# Patient Record
Sex: Female | Born: 1965 | Hispanic: No | Marital: Married | Smoking: Never smoker
Health system: Southern US, Community
[De-identification: ages and names within clinical notes are randomized; demographics above are authoritative.]

## PROBLEM LIST (undated history)

## (undated) DIAGNOSIS — E119 Type 2 diabetes mellitus without complications: Secondary | ICD-10-CM

## (undated) DIAGNOSIS — I10 Essential (primary) hypertension: Secondary | ICD-10-CM

## (undated) HISTORY — DX: Essential (primary) hypertension: I10

---

## 2013-12-11 ENCOUNTER — Other Ambulatory Visit: Payer: Self-pay | Admitting: Internal Medicine

## 2013-12-11 ENCOUNTER — Ambulatory Visit: Payer: Medicaid Other | Attending: Internal Medicine | Admitting: Internal Medicine

## 2013-12-11 VITALS — BP 154/83 | HR 126 | Temp 98.9°F | Resp 15 | Ht 62.0 in | Wt 254.4 lb

## 2013-12-11 DIAGNOSIS — N644 Mastodynia: Secondary | ICD-10-CM

## 2013-12-11 DIAGNOSIS — Z1231 Encounter for screening mammogram for malignant neoplasm of breast: Secondary | ICD-10-CM

## 2013-12-11 DIAGNOSIS — M25569 Pain in unspecified knee: Secondary | ICD-10-CM | POA: Insufficient documentation

## 2013-12-11 DIAGNOSIS — I10 Essential (primary) hypertension: Secondary | ICD-10-CM

## 2013-12-11 LAB — COMPLETE METABOLIC PANEL WITH GFR
ALT: 12 U/L (ref 0–35)
AST: 14 U/L (ref 0–37)
Albumin: 4.3 g/dL (ref 3.5–5.2)
Alkaline Phosphatase: 114 U/L (ref 39–117)
GFR, Est African American: >89 mL/min
Potassium: 3.7 mEq/L (ref 3.5–5.3)
Sodium: 139 mEq/L (ref 135–145)
Total Bilirubin: 0.4 mg/dL (ref 0.3–1.2)
Total Protein: 7.7 g/dL (ref 6.0–8.3)

## 2013-12-11 LAB — LIPID PANEL
HDL: 47 mg/dL (ref >39)
Total CHOL/HDL Ratio: 5.5 Ratio
Triglycerides: 244 mg/dL — ABNORMAL HIGH (ref <150)
VLDL: 49 mg/dL — ABNORMAL HIGH (ref 0–40)

## 2013-12-11 LAB — CBC WITH DIFFERENTIAL/PLATELET
Basophils Absolute: 0 10*3/uL (ref 0.0–0.1)
Basophils Relative: 0 % (ref 0–1)
Hemoglobin: 12.8 g/dL (ref 12.0–15.0)
Lymphocytes Relative: 41 % (ref 12–46)
MCHC: 33.1 g/dL (ref 30.0–36.0)
Monocytes Relative: 7 % (ref 3–12)
Neutro Abs: 4.5 10*3/uL (ref 1.7–7.7)
Neutrophils Relative %: 50 % (ref 43–77)
Platelets: 401 10*3/uL — ABNORMAL HIGH (ref 150–400)
RBC: 4.78 MIL/uL (ref 3.87–5.11)
RDW: 14.9 % (ref 11.5–15.5)
WBC: 8.9 10*3/uL (ref 4.0–10.5)

## 2013-12-11 LAB — TSH: TSH: 1.508 u[IU]/mL (ref 0.350–4.500)

## 2013-12-11 LAB — POCT GLYCOSYLATED HEMOGLOBIN (HGB A1C): Hemoglobin A1C: 5.9

## 2013-12-11 MED ORDER — MELOXICAM 7.5 MG PO TABS: 7.5000 mg | ORAL_TABLET | Freq: Two times a day (BID) | ORAL | Status: DC

## 2013-12-11 MED ORDER — METOPROLOL TARTRATE 25 MG PO TABS: 25.0000 mg | ORAL_TABLET | Freq: Two times a day (BID) | ORAL | Status: DC

## 2013-12-11 NOTE — Progress Notes (Signed)
Patient ID: Taylor Aguirre, female   DOB: 1966-09-23, 47 y.o.   MRN: [id number]   CC:  HPI: 47 year old female, here to establish care. Complains of bilateral knee pain. Patient from [place] 1 year ago. She also complains of low back pain. It is worse with ambulation. She has not been taking anything for the pain She has history of hypertension but quit taking her medication before moving to the [place]. She has never had a mammogram. She has never had a Pap smear. She is a nonsmoker nonalcoholic  Family history Father had hypertension   Not on File Past Medical History  Diagnosis Date  . Hypertension    No current outpatient prescriptions on file prior to visit.   No current facility-administered medications on file prior to visit.   History reviewed. No pertinent family history. History   Social History  . Marital Status: Married    Spouse Name: N/A    Number of Children: N/A  . Years of Education: N/A   Occupational History  . Not on file.   Social History Main Topics  . Smoking status: Never Smoker   . Smokeless tobacco: Not on file  . Alcohol Use: No  . Drug Use: Not on file  . Sexual Activity: Not on file   Other Topics Concern  . Not on file   Social History Narrative  . No narrative on file    Review of Systems  Constitutional: Negative for fever, chills, diaphoresis, activity change, appetite change and fatigue.  HENT: Negative for ear pain, nosebleeds, congestion, facial swelling, rhinorrhea, neck pain, neck stiffness and ear discharge.   Eyes: Negative for pain, discharge, redness, itching and visual disturbance.  Respiratory: Negative for cough, choking, chest tightness, shortness of breath, wheezing and stridor.   Cardiovascular: Negative for chest pain, palpitations and leg swelling.  Gastrointestinal: Negative for abdominal distention.  Genitourinary: Negative for dysuria, urgency, frequency, hematuria, flank pain, decreased urine volume,  difficulty urinating and dyspareunia.  Musculoskeletal: Negative for back pain, joint swelling, bilateral knee pain  Neurological: Negative for dizziness, tremors, seizures, syncope, facial asymmetry, speech difficulty, weakness, light-headedness, numbness and headaches.  Hematological: Negative for adenopathy. Does not bruise/bleed easily.  Psychiatric/Behavioral: Negative for hallucinations, behavioral problems, confusion, dysphoric mood, decreased concentration and agitation.    Objective:   Filed Vitals:   12/11/13 1003  BP: 154/83  Pulse: 126  Temp: 98.9 F (37.2 C)  Resp: 15    Physical Exam  Constitutional: Appears well-developed and well-nourished. No distress.  HENT: Normocephalic. External right and left ear normal. Oropharynx is clear and moist.  Eyes: Conjunctivae and EOM are normal. PERRLA, no scleral icterus.  Neck: Normal ROM. Neck supple. No JVD. No tracheal deviation. No thyromegaly.  CVS: RRR, S1/S2 +, no murmurs, no gallops, no carotid bruit.  Pulmonary: Effort and breath sounds normal, no stridor, rhonchi, wheezes, rales.  Abdominal: Soft. BS +,  no distension, tenderness, rebound or guarding.  Musculoskeletal: Normal range of motion. No edema and no tenderness.  Lymphadenopathy: No lymphadenopathy noted, cervical, inguinal. Neuro: Alert. Normal reflexes, muscle tone coordination. No cranial nerve deficit. Skin: Skin is warm and dry. No rash noted. Not diaphoretic. No erythema. No pallor.  Psychiatric: Normal mood and affect. Behavior, judgment, thought content normal.   No results found for this basename: WBC, HGB, HCT, MCV, PLT   No results found for this basename: CREATININE, BUN, NA, K, CL, CO2    No results found for this basename: HGBA1C   Lipid Panel  No results found for this basename: chol, trig, hdl, cholhdl, vldl, ldlcalc       Assessment and plan:   There are no active problems to display for this patient.      Bilateral knee pain Most  likely osteoarthritis We'll obtain plain radiographs We'll prescribe her meloxicam  Hypertension Patient is tachycardic therefore start the patient on metoprolol  Establish care Patient refusing immunization We'll schedule her for mammogram Baseline labs  Patient to return in one month   The patient was given clear instructions to go to ER or return to medical center if symptoms don't improve, worsen or new problems develop. The patient verbalized understanding. The patient was told to call to get any lab results if not heard anything in the next week.

## 2013-12-11 NOTE — Progress Notes (Signed)
Pt is here to establish care and for hypertension. Currently taking medication for hypertension, out of medication and does not know the name of it. Pt is using the interpreter line.

## 2013-12-12 ENCOUNTER — Telehealth: Payer: Self-pay | Admitting: *Deleted

## 2013-12-12 LAB — VITAMIN D 25 HYDROXY (VIT D DEFICIENCY, FRACTURES): Vit D, 25-Hydroxy: 32 ng/mL (ref 30–89)

## 2013-12-12 NOTE — Telephone Encounter (Signed)
Message copied by Ludia Gartland, [place] R on Tue Dec 12, 2013  2:12 PM ------      Message from: Susie Cassette MD, [facility]      Created: Tue Dec 12, 2013 10:39 AM       Please notify patient of the patient's LDL cholesterol and triglycerides are elevated,physical prescription for Lipitor 20 mg per day and asked the patient to start a low-fat diet ------

## 2013-12-21 ENCOUNTER — Ambulatory Visit (HOSPITAL_COMMUNITY): Payer: Medicaid Other | Attending: Internal Medicine

## 2014-01-01 ENCOUNTER — Ambulatory Visit: Payer: Medicaid Other

## 2014-01-08 ENCOUNTER — Other Ambulatory Visit: Payer: Self-pay | Admitting: Internal Medicine

## 2014-01-08 ENCOUNTER — Ambulatory Visit (HOSPITAL_COMMUNITY): Payer: Medicaid Other | Attending: Internal Medicine

## 2014-01-08 ENCOUNTER — Ambulatory Visit
Admission: RE | Admit: 2014-01-08 | Discharge: 2014-01-08 | Disposition: A | Discharge status: Home / Self Care | Payer: Medicaid Other | Source: Ambulatory Visit | Attending: Internal Medicine | Admitting: Internal Medicine

## 2014-01-08 DIAGNOSIS — M25561 Pain in right knee: Secondary | ICD-10-CM

## 2014-01-08 DIAGNOSIS — I10 Essential (primary) hypertension: Secondary | ICD-10-CM

## 2014-01-08 DIAGNOSIS — N644 Mastodynia: Secondary | ICD-10-CM

## 2014-01-08 DIAGNOSIS — M25562 Pain in left knee: Secondary | ICD-10-CM

## 2014-01-08 DIAGNOSIS — M25569 Pain in unspecified knee: Secondary | ICD-10-CM

## 2014-01-11 ENCOUNTER — Ambulatory Visit: Payer: Medicaid Other | Admitting: Internal Medicine

## 2014-01-16 ENCOUNTER — Ambulatory Visit
Admission: RE | Admit: 2014-01-16 | Discharge: 2014-01-16 | Disposition: A | Discharge status: Home / Self Care | Payer: Medicaid Other | Source: Ambulatory Visit | Attending: Internal Medicine | Admitting: Internal Medicine

## 2014-01-16 DIAGNOSIS — Z1231 Encounter for screening mammogram for malignant neoplasm of breast: Secondary | ICD-10-CM

## 2014-02-15 ENCOUNTER — Other Ambulatory Visit: Payer: Self-pay | Admitting: *Deleted

## 2014-02-15 DIAGNOSIS — I10 Essential (primary) hypertension: Secondary | ICD-10-CM

## 2014-02-15 DIAGNOSIS — G894 Chronic pain syndrome: Secondary | ICD-10-CM

## 2014-02-15 MED ORDER — MELOXICAM 7.5 MG PO TABS: 7.5000 mg | ORAL_TABLET | Freq: Two times a day (BID) | ORAL | Status: AC

## 2014-02-15 MED ORDER — METOPROLOL TARTRATE 25 MG PO TABS: 25.0000 mg | ORAL_TABLET | Freq: Two times a day (BID) | ORAL | Status: AC

## 2014-08-29 ENCOUNTER — Encounter (HOSPITAL_COMMUNITY): Payer: Self-pay | Admitting: Emergency Medicine

## 2014-08-29 ENCOUNTER — Emergency Department (HOSPITAL_COMMUNITY)
Admission: EM | Admit: 2014-08-29 | Discharge: 2014-08-29 | Disposition: A | Discharge status: Home / Self Care | Payer: Medicaid Other | Source: Home / Self Care | Attending: Emergency Medicine | Admitting: Emergency Medicine

## 2014-08-29 DIAGNOSIS — J069 Acute upper respiratory infection, unspecified: Secondary | ICD-10-CM

## 2014-08-29 DIAGNOSIS — I10 Essential (primary) hypertension: Secondary | ICD-10-CM

## 2014-08-29 LAB — POCT RAPID STREP A: Streptococcus, Group A Screen (Direct): NEGATIVE

## 2014-08-29 MED ORDER — METOPROLOL TARTRATE 25 MG PO TABS: 25.0000 mg | ORAL_TABLET | Freq: Two times a day (BID) | ORAL | Status: AC

## 2014-08-29 MED ORDER — IPRATROPIUM BROMIDE 0.06 % NA SOLN: 2.0000 | Freq: Four times a day (QID) | NASAL | Status: AC

## 2014-08-29 MED ORDER — NAPROXEN 500 MG PO TABS: 500.0000 mg | ORAL_TABLET | Freq: Two times a day (BID) | ORAL | Status: AC

## 2014-08-29 NOTE — ED Provider Notes (Signed)
Chief Complaint   Generalized Body Aches   History of Present Illness   Taylor Aguirre is a 48 year old female who's had a three-day history of nasal congestion, rhinorrhea, bilateral earache, sore throat, neck pain, and headache. She denies any cough or GI symptoms. She also needs a refill on her blood pressure medicine. She took her last dose today. She's on metoprolol tolerated 25 mg twice a day. [facility]. She cannot get in today to get her medicines refilled. She denies any medication side effects, headaches, dizziness, blurry vision, chest pain, shortness of breath, ankle edema, or strokelike symptoms.  Review of Systems   Other than as noted above, the patient denies any of the following symptoms: Systemic:  No fevers, chills, sweats, or myalgias. Eye:  No redness or discharge. ENT:  No ear pain, headache, nasal congestion, drainage, sinus pressure, or sore throat. Neck:  No neck pain, stiffness, or swollen glands. Lungs:  No cough, sputum production, hemoptysis, wheezing, chest tightness, shortness of breath or chest pain. GI:  No abdominal pain, nausea, vomiting or diarrhea.  PMFSH   Past medical history, family history, social history, meds, and allergies were reviewed.   Physical exam   Vital signs:  BP 140/89  Pulse 100  Temp(Src) 99.5 F (37.5 C) (Oral)  Resp 16  SpO2 100% General:  Alert and oriented.  In no distress.  Skin warm and dry. Eye:  No conjunctival injection or drainage. Lids were normal. ENT:  TMs and canals were normal, without erythema or inflammation.  Nasal mucosa was clear and uncongested, without drainage.  Mucous membranes were moist.  Pharynx was clear with no exudate or drainage.  There were no oral ulcerations or lesions. Neck:  Supple, no adenopathy, tenderness or mass. Lungs:  No respiratory distress.  Lungs were clear to auscultation, without wheezes, rales or rhonchi.  Breath sounds were clear and  equal bilaterally.  Heart:  Regular rhythm, without gallops, murmers or rubs. Skin:  Clear, warm, and dry, without rash or lesions.  Labs   Results for orders placed during the hospital encounter of 08/29/14  POCT RAPID STREP A ([facility]RG CARE ONLY)      Result Value Ref Range   Streptococcus, Group A Screen (Direct) NEGATIVE  NEGATIVE    Assessment     The primary encounter diagnosis was Viral URI. A diagnosis of Essential hypertension was also pertinent to this visit.  Plan    1.  Meds:  The following meds were prescribed:   Discharge Medication List as of 08/29/2014 11:35 AM    START taking these medications   Details  ipratropium (ATROVENT) 0.06 % nasal spray Place 2 sprays into both nostrils 4 (four) times daily., Starting 08/29/2014, Until Discontinued, Normal    !! metoprolol tartrate (LOPRESSOR) 25 MG tablet Take 1 tablet (25 mg total) by mouth 2 (two) times daily., Starting 08/29/2014, Until Discontinued, Normal    naproxen (NAPROSYN) 500 MG tablet Take 1 tablet (500 mg total) by mouth 2 (two) times daily., Starting 08/29/2014, Until Discontinued, Normal     !! - Potential duplicate medications found. Please discuss with provider.      2.  Patient Education/Counseling:  The patient was given appropriate handouts, self care instructions, and instructed in symptomatic relief.  Instructed to get extra fluids and extra rest.    3.  Follow up:  The patient was told to follow up here if no better in 3 to 4 days, or sooner  if becoming worse in any way, and given some red flag symptoms such as increasing fever, difficulty breathing, chest pain, or persistent vomiting which would prompt immediate return.       Reuben Likes, MD 08/29/14 [phone]

## 2014-08-29 NOTE — ED Notes (Signed)
C/o not felt well x past few days. Pan in face, throat. Out of unknown bp medication, did not bring empty bottle, but would like a refill

## 2014-08-29 NOTE — Discharge Instructions (Signed)
Most upper respiratory infections are caused by viruses and do not require antibiotics.  We try to save the antibiotics for when we really need them to prevent bacteria from developing resistance to them.  Here are a few hints about things that can be done at home to help get over an upper respiratory infection quicker: ° °Get extra sleep and extra fluids.  Get 7 to 9 hours of sleep per night and 6 to 8 glasses of water a day.  Getting extra sleep keeps the immune system from getting run down.  Most people with an upper respiratory infection are a little dehydrated.  The extra fluids also keep the secretions liquified and easier to deal with.  Also, get extra vitamin C.  4000 mg per day is the recommended dose. °For the aches, headache, and fever, acetaminophen or ibuprofen are helpful.  These can be alternated every 4 hours.  People with liver disease should avoid large amounts of acetaminophen, and people with ulcer disease, gastroesophageal reflux, gastritis, congestive heart failure, chronic kidney disease, coronary artery disease and the elderly should avoid ibuprofen. °For nasal congestion try Mucinex-D, or if you're having lots of sneezing or clear nasal drainage use Zyrtec-D. People with high blood pressure can take these if their blood pressure is controlled, if not, it's best to avoid the forms with a "D" (decongestants).  You can use the plain Mucinex, Allegra, Claritin, or Zyrtec even if your blood pressure is not controlled.   °A Saline nasal spray such as Ocean Spray can also help.  You can add a decongestant sprays such as Afrin, but you should not use the decongestant sprays for more than 3 or 4 days since they can be habituating.  Breathe Rite nasal strips can also offer a non-drug alternative treatment to nasal congestion, especially at night. °For people with symptoms of sinusitis, sleeping with your head elevated can be helpful.  For sinus pain, moist, hot compresses to the face may provide some  relief.  Many people find that inhaling steam as in a shower or from a pot of steaming water can help. °For any viral infection, zinc containing lozenges such as Cold-Eze or Zicam are helpful.  Zinc helps to fight viral infection.  Hot salt water gargles (8 oz of hot water, 1/2 tsp of table salt, and a pinch of baking soda) can give relief as well as hot beverages such as hot tea.  Sucrets extra strength lozenges will help the sore throat.  °For the cough, take Delsym 2 tsp every 12 hours.  It has also been found recently that Aleve can help control a cough.  The dose is 1 to 2 tablets twice daily with food.  This can be combined with Delsym. (Note, if you are taking ibuprofen, you should not take Aleve as well--take one or the other.) °A cool mist vaporizer will help keep your mucous membranes from drying out.  ° °It's important when you have an upper respiratory infection not to pass the infection to others.  This involves being very careful about the following: ° °Frequent hand washing or use of hand sanitizer, especially after coughing, sneezing, blowing your nose or touching your face, nose or eyes. °Do not shake hands or touch anyone and try to avoid touching surfaces that other people use such as doorknobs, shopping carts, telephones and computer keyboards. °Use tissues and dispose of them properly in a garbage can or ziplock bag. °Cough into your sleeve. °Do not let others eat or   drink after you. ° °It's also important to recognize the signs of serious illness and get evaluated if they occur: °Any respiratory infection that lasts more than 7 to 10 days.  Yellow nasal drainage and sputum are not reliable indicators of a bacterial infection, but if they last for more than 1 week, see your doctor. °Fever and sore throat can indicate strep. °Fever and cough can indicate influenza or pneumonia. °Any kind of severe symptom such as difficulty breathing, intractable vomiting, or severe pain should prompt you to see  a doctor as soon as possible. ° ° °Your body's immune system is really the thing that will get rid of this infection.  Your immune system is comprised of 2 types of specialized cells called T cells and B cells.  T cells coordinate the array of cells in your body that engulf invading bacteria or viruses while B cells orchestrate the production of antibodies that neutralize infection.  Anything we do or any medications we give you, will just strengthen your immune system or help it clear up the infection quicker.  Here are a few helpful hints to improve your immune system to help overcome this illness or to prevent future infections: °· A few vitamins can improve the health of your immune system.  That's why your diet should include plenty of fruits, vegetables, fish, nuts, and whole grains. °· Vitamin A and bet-carotene can increase the cells that fight infections (T cells and B cells).  Vitamin A is abundant in dark greens and orange vegetables such as spinach, greens, sweet potatoes, and carrots. °· Vitamin B6 contributes to the maturation of white blood cells, the cells that fight disease.  Foods with vitamin B6 include cold cereal and bananas. °· Vitamin C is credited with preventing colds because it increases white blood cells and also prevents cellular damage.  Citrus fruits, peaches and green and red bell peppers are all hight in vitamin C. °· Vitamin E is an anti-oxidant that encourages the production of natural killer cells which reject foreign invaders and B cells that produce antibodies.  Foods high in vitamin E include wheat germ, nuts and seeds. °· Foods high in omega-3 fatty acids found in foods like salmon, tuna and mackerel boost your immune system and help cells to engulf and absorb germs. °· Probiotics are good bacteria that increase your T cells.  These can be found in yogurt and are available in supplements such as Culturelle or Align. °· Moderate exercise increases the strength of your immune  system and your ability to recover from illness.  I suggest 3 to 5 moderate intensity 30 minute workouts per week.   °· Sleep is another component of maintaining a strong immune system.  It enables your body to recuperate from the day's activities, stress and work.  My recommendation is to get between 7 and 9 hours of sleep per night. °· If you smoke, try to quit completely or at least cut down.  Drink alcohol only in moderation if at all.  No more than 2 drinks daily for men or 1 for women. °· Get a flu vaccine early in the fall or if you have not gotten one yet, once this illness has run its course.  If you are over 65, a smoker, or an asthmatic, get a pneumococcal vaccine. °· My final recommendation is to maintain a healthy weight.  Excess weight can impair the immune system by interfering with the way the immune system deals with invading viruses or   bacteria.  Blood pressure over the ideal can put you at higher risk for stroke, heart disease, and kidney failure.  For this reason, it's important to try to get your blood pressure as close as possible to the ideal.  The ideal blood pressure is 120/80.  Blood pressures from 120-139 systolic over 80-89 diastolic are labeled as "prehypertension."  This means you are at higher risk of developing hypertension in the future.  Blood pressures in this range are not treated with medication, but lifestyle changes are recommended to prevent progression to hypertension.  Blood pressures of 140 and above systolic over 90 and above diastolic are classified as hypertension and are treated with medications.  Lifestyle changes which can benefit both prehypertension and hypertension include the following:   Salt and sodium restriction.  Weight loss.  Regular exercise.  Avoidance of tobacco.  Avoidance of excess alcohol.  The "D.A.S.H" diet.   People with hypertension and prehypertension should limit their salt intake to less than 1500 mg daily.  Reading the  nutrition information on the label of many prepared foods can give you an idea of how much sodium you're consuming at each meal.  Remember that the most important number on the nutrition information is the serving size.  It may be smaller than you think.  Try to avoid adding extra salt at the table.  You may add small amounts of salt while cooking.  Remember that salt is an acquired taste and you may get used to a using a whole lot less salt than you are using now.  Using less salt lets the food's natural flavors come through.  You might want to consider using salt substitutes, potassium chloride, pepper, or blends of herbs and spices to enhance the flavor of your food.  Foods that contain the most salt include: processed meats (like ham, bacon, lunch meat, sausage, hot dogs, and breakfast meat), chips, pretzels, salted nuts, soups, salty snacks, canned foods, junk food, fast food, restaurant food, mustard, pickles, pizza, popcorn, soy sauce, and worcestershire sauce--quite a list!  You might ask, "Is there anything I can eat?"  The answer is, "yes."  Fruits and vegetables are usually low in salt.  Fresh is better than frozen which is better than canned.  If you have canned vegetables, you can cut down on the salt content by rinsing them in tap water 3 times before cooking.     Weight loss is the second thing you can do to lower your blood pressure.  Getting to and maintaining ideal weight will often normalize your blood pressure and allow you to avoid medications, entirely, cut way down on your dosage of medications, or allow to wean off your meds.  (Note, this should only be done under the supervision of your primary care doctor.)  Of course, weight loss takes time and you may need to be on medication in the meantime.  You shoot for a body mass index of 20-25.  When you go to the urgent care or to your primary care doctor, they should calculate your BMI.  If you don't know what it is, ask.  You can calculate  your BMI with the following formula:  Weight in pounds x 703/ (height in inches) x (height in inches).  There are many good diets out there: Weight Watchers and the D.A.S.H. Diet are the best, but often, just modifying a few factors can be helpful:  Don't skip meals, don't eat out, and keeping a food diary.  I  I do not recommend fad diets or diet pills which often raise blood pressure.  ° °· Everyone should get regular exercise, but this is particularly important for people with high blood pressure.  Just about any exercise is good.  The only exercise which may be harmful is lifting extreme heavy weights.  I recommend moderate exercise such as walking for 30 minutes 5 days a week.  Going to the gym for a 50 minute workout 3 times a week is also good.  This amounts to 150 minutes of exercise weekly. ° °· Anyone with high blood pressure should avoid any use of tobacco.  Tobacco use does not elevate blood pressure, but it increases the risk of heart disease and stroke.  If you are interested in quitting, discuss with your doctor how to quit.  If you are not interested in quitting, ask yourself, "What would my life be like in 10 years if I continue to smoke?"  "How will I know when it is time to quit?"  "How would my life be better if I were to quit." ° °· Excess alcohol intake can raise the blood pressure.  The safe alcohol intake is 2 drinks or less per day for men and 1 drink per day or less for women. ° °· There is a very good diet which I recommend that has been designed for people with blood pressure called the D.A.S.H. Diet (dietary approaches to stop hypertension).  It consists of fruits, vegetables, lean meats, low fat dairy, whole grains, nuts and seeds.  It is very low in salt and sodium.  It has also been found to have other beneficial health effects such as lowering cholesterol and helping lose weight.  It has been developed by the National Institutes of Health and can be downloaded from the internet without  any cost. Just do a web search on "D.A.S.H. Diet." or go the NIH website (www.nih.gov).  There are also cookbooks and diet plans that can be gotten from [employer] to help you with this diet. ° ° °

## 2014-08-31 LAB — CULTURE, GROUP A STREP

## 2014-09-05 ENCOUNTER — Telehealth (HOSPITAL_COMMUNITY): Payer: Self-pay | Admitting: Emergency Medicine

## 2014-09-05 MED ORDER — AMOXICILLIN 500 MG PO CAPS: 500.0000 mg | ORAL_CAPSULE | Freq: Three times a day (TID) | ORAL | Status: AC

## 2014-09-05 NOTE — ED Notes (Addendum)
Culture came back positive for group A strep. She was not treated and has no known allergies. We'll send her a prescription for amoxicillin 500 mg #30 one [facility]  Reuben Likes, MD 09/05/14 [phone]  The patient was called by an interpreter phone, and informed of this result, and informed of the treatment.  Reuben Likes, MD 09/05/14 [phone]

## 2014-09-07 NOTE — ED Notes (Signed)
treatment discussed w patient via [employer]. No further action required

## 2014-10-19 ENCOUNTER — Emergency Department (HOSPITAL_COMMUNITY)
Admission: EM | Admit: 2014-10-19 | Discharge: 2014-10-19 | Discharge status: Absent without leave | Payer: Medicaid Other | Source: Home / Self Care

## 2014-10-23 ENCOUNTER — Encounter (HOSPITAL_COMMUNITY): Payer: Self-pay | Admitting: Emergency Medicine

## 2014-10-23 ENCOUNTER — Emergency Department (INDEPENDENT_AMBULATORY_CARE_PROVIDER_SITE_OTHER)
Admission: EM | Admit: 2014-10-23 | Discharge: 2014-10-23 | Disposition: A | Discharge status: Home / Self Care | Payer: Self-pay | Source: Home / Self Care | Attending: Family Medicine | Admitting: Family Medicine

## 2014-10-23 DIAGNOSIS — J069 Acute upper respiratory infection, unspecified: Secondary | ICD-10-CM

## 2014-10-23 LAB — POCT RAPID STREP A: Streptococcus, Group A Screen (Direct): NEGATIVE

## 2014-10-23 NOTE — ED Provider Notes (Signed)
CSN: [id number]     Arrival date & time 10/23/14  [id number] History   First MD Initiated Contact with Patient 10/23/14 [phone]     Chief Complaint  Patient presents with  . Sore Throat   (Consider location/radiation/quality/duration/timing/severity/associated sxs/prior Treatment) HPI Comments: 48 year old obese female from PMN is accompanied by a relative who is her interpreter. Complaining of cough, PND for one week. Denies shortness of breath or chest pain.   Past Medical History  Diagnosis Date  . Hypertension    History reviewed. No pertinent past surgical history. No family history on file. History  Substance Use Topics  . Smoking status: Never Smoker   . Smokeless tobacco: Not on file  . Alcohol Use: No   OB History    No data available     Review of Systems  Constitutional: Negative.   HENT: Positive for postnasal drip and rhinorrhea. Negative for sore throat.   Respiratory: Negative for cough and shortness of breath.   Cardiovascular: Negative.   Gastrointestinal: Negative.   Neurological: Negative.     Allergies  Review of patient's allergies indicates no known allergies.  Home Medications   Prior to Admission medications   Medication Sig Start Date End Date Taking? Authorizing Provider  amoxicillin (AMOXIL) 500 MG capsule Take 1 capsule (500 mg total) by mouth 3 (three) times daily. 09/05/14   Reuben Likesavid C Keller, MD  ipratropium (ATROVENT) 0.06 % nasal spray Place 2 sprays into both nostrils 4 (four) times daily. 08/29/14   Reuben Likesavid C Keller, MD  meloxicam (MOBIC) 7.5 MG tablet Take 1 tablet (7.5 mg total) by mouth 2 (two) times daily. 02/15/14   Quentin Angstlugbemiga E Jegede, MD  metoprolol tartrate (LOPRESSOR) 25 MG tablet Take 1 tablet (25 mg total) by mouth 2 (two) times daily. 02/15/14   Quentin Angstlugbemiga E Jegede, MD  metoprolol tartrate (LOPRESSOR) 25 MG tablet Take 1 tablet (25 mg total) by mouth 2 (two) times daily. 08/29/14   Reuben Likesavid C Keller, MD  naproxen (NAPROSYN) 500 MG tablet Take 1  tablet (500 mg total) by mouth 2 (two) times daily. 08/29/14   Reuben Likesavid C Keller, MD   BP 147/66 mmHg  Pulse 79  Temp(Src) 98.5 F (36.9 C) (Oral)  Resp 12  SpO2 100% Physical Exam  Constitutional: She appears well-developed and well-nourished. No distress.  HENT:  Right Ear: External ear normal.  Left Ear: External ear normal.  Mouth/Throat: No oropharyngeal exudate.  OP with minor erythema, cobblestoning and clear PND  Eyes: Conjunctivae and EOM are normal.  Neck: Normal range of motion. Neck supple.  Cardiovascular: Normal rate, regular rhythm and normal heart sounds.   Pulmonary/Chest: Effort normal and breath sounds normal. No respiratory distress. She has no wheezes. She has no rales.  Lymphadenopathy:    She has no cervical adenopathy.  Neurological: She is alert. She exhibits normal muscle tone.  Skin: Skin is warm and dry.  Psychiatric: She has a normal mood and affect.  Nursing note and vitals reviewed.   ED Course  Procedures (including critical care time) Labs Review Labs Reviewed  POCT RAPID STREP A ([facility] ONLY)   Results for orders placed or performed during the hospital encounter of 10/23/14  POCT rapid strep A [facility])  Result Value Ref Range   Streptococcus, Group A Screen (Direct) NEGATIVE NEGATIVE    Imaging Review No results found.   MDM   1. URI (upper respiratory infection)    Allegra 60 mg twice daily for drainage Cepacol  lozenges Robitussin DM for cough Tylenol for discomfort      Hayden Rasmussenavid Johnthomas Lader, NP 10/23/14 1007

## 2014-10-23 NOTE — Discharge Instructions (Signed)
Upper Respiratory Infection, Adult Allegra 60 mg twice daily for drainage Cepacol lozenges Robitussin DM for cough Tylenol for discomfort An upper respiratory infection (URI) is also sometimes known as the common cold. The upper respiratory tract includes the nose, sinuses, throat, trachea, and bronchi. Bronchi are the airways leading to the lungs. Most people improve within 1 week, but symptoms can last up to 2 weeks. A residual cough may last even longer.  CAUSES Many different viruses can infect the tissues lining the upper respiratory tract. The tissues become irritated and inflamed and often become very moist. Mucus production is also common. A cold is contagious. You can easily spread the virus to others by oral contact. This includes kissing, sharing a glass, coughing, or sneezing. Touching your mouth or nose and then touching a surface, which is then touched by another person, can also spread the virus. SYMPTOMS  Symptoms typically develop 1 to 3 days after you come in contact with a cold virus. Symptoms vary from person to person. They may include:  Runny nose.  Sneezing.  Nasal congestion.  Sinus irritation.  Sore throat.  Loss of voice (laryngitis).  Cough.  Fatigue.  Muscle aches.  Loss of appetite.  Headache.  Low-grade fever. DIAGNOSIS  You might diagnose your own cold based on familiar symptoms, since most people get a cold 2 to 3 times a year. Your caregiver can confirm this based on your exam. Most importantly, your caregiver can check that your symptoms are not due to another disease such as strep throat, sinusitis, pneumonia, asthma, or epiglottitis. Blood tests, throat tests, and X-rays are not necessary to diagnose a common cold, but they may sometimes be helpful in excluding other more serious diseases. Your caregiver will decide if any further tests are required. RISKS AND COMPLICATIONS  You may be at risk for a more severe case of the common cold if you  smoke cigarettes, have chronic heart disease (such as heart failure) or lung disease (such as asthma), or if you have a weakened immune system. The very young and very old are also at risk for more serious infections. Bacterial sinusitis, middle ear infections, and bacterial pneumonia can complicate the common cold. The common cold can worsen asthma and chronic obstructive pulmonary disease (COPD). Sometimes, these complications can require emergency medical care and may be life-threatening. PREVENTION  The best way to protect against getting a cold is to practice good hygiene. Avoid oral or hand contact with people with cold symptoms. Wash your hands often if contact occurs. There is no clear evidence that vitamin C, vitamin E, echinacea, or exercise reduces the chance of developing a cold. However, it is always recommended to get plenty of rest and practice good nutrition. TREATMENT  Treatment is directed at relieving symptoms. There is no cure. Antibiotics are not effective, because the infection is caused by a virus, not by bacteria. Treatment may include:  Increased fluid intake. Sports drinks offer valuable electrolytes, sugars, and fluids.  Breathing heated mist or steam (vaporizer or shower).  Eating chicken soup or other clear broths, and maintaining good nutrition.  Getting plenty of rest.  Using gargles or lozenges for comfort.  Controlling fevers with ibuprofen or acetaminophen as directed by your caregiver.  Increasing usage of your inhaler if you have asthma. Zinc gel and zinc lozenges, taken in the first 24 hours of the common cold, can shorten the duration and lessen the severity of symptoms. Pain medicines may help with fever, muscle aches, and throat  pain. A variety of non-prescription medicines are available to treat congestion and runny nose. Your caregiver can make recommendations and may suggest nasal or lung inhalers for other symptoms.  HOME CARE INSTRUCTIONS   Only take  over-the-counter or prescription medicines for pain, discomfort, or fever as directed by your caregiver.  Use a warm mist humidifier or inhale steam from a shower to increase air moisture. This may keep secretions moist and make it easier to breathe.  Drink enough water and fluids to keep your urine clear or pale yellow.  Rest as needed.  Return to work when your temperature has returned to normal or as your caregiver advises. You may need to stay home longer to avoid infecting others. You can also use a face mask and careful hand washing to prevent spread of the virus. SEEK MEDICAL CARE IF:   After the first few days, you feel you are getting worse rather than better.  You need your caregiver's advice about medicines to control symptoms.  You develop chills, worsening shortness of breath, or brown or red sputum. These may be signs of pneumonia.  You develop yellow or brown nasal discharge or pain in the face, especially when you bend forward. These may be signs of sinusitis.  You develop a fever, swollen neck glands, pain with swallowing, or white areas in the back of your throat. These may be signs of strep throat. SEEK IMMEDIATE MEDICAL CARE IF:   You have a fever.  You develop severe or persistent headache, ear pain, sinus pain, or chest pain.  You develop wheezing, a prolonged cough, cough up blood, or have a change in your usual mucus (if you have chronic lung disease).  You develop sore muscles or a stiff neck. Document Released: 05/26/2001 Document Revised: 02/22/2012 Document Reviewed: 03/07/2014 [facility] Patient Information 2015 [place], [place]. This information is not intended to replace advice given to you by your health care provider. Make sure you discuss any questions you have with your health care provider.

## 2014-10-23 NOTE — ED Notes (Signed)
Sore throat, cough, runny nose, denies fever

## 2014-10-25 LAB — CULTURE, GROUP A STREP

## 2015-10-26 IMAGING — CR DG LUMBAR SPINE COMPLETE 4+V
5 series · 5 of 5 positions shown · non-contrast
Comparison: None.

CLINICAL DATA: Bilateral knee and back pain.

EXAM:
LUMBAR SPINE - COMPLETE 4+ VIEW

[t l-spine a.p. *]
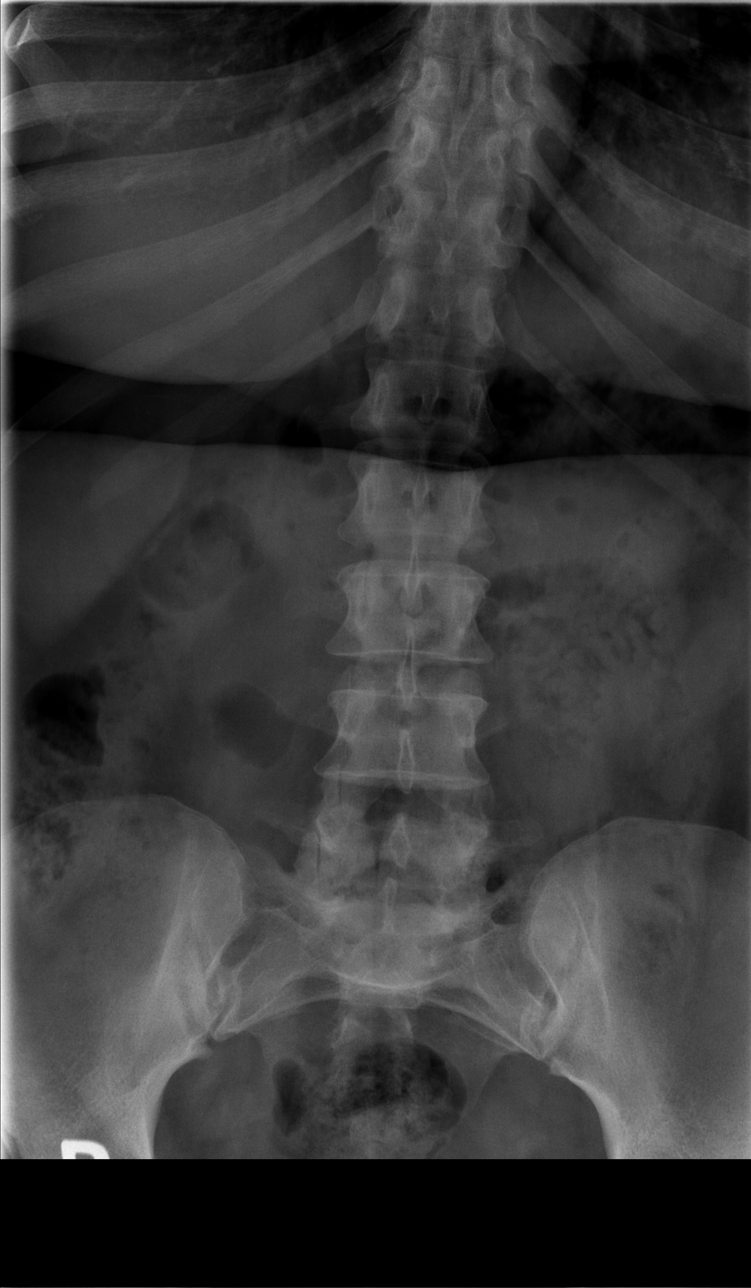

[t l-spine oblique exposure (1 of 2)]
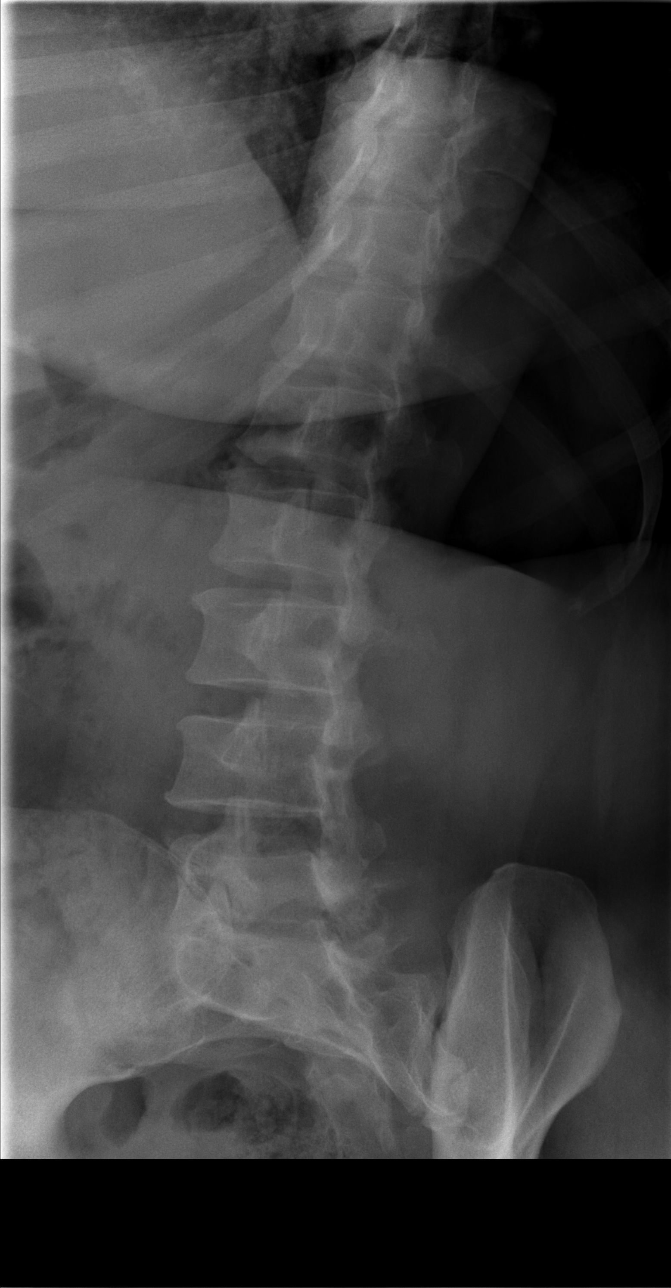

[t l-spine oblique exposure (2 of 2)]
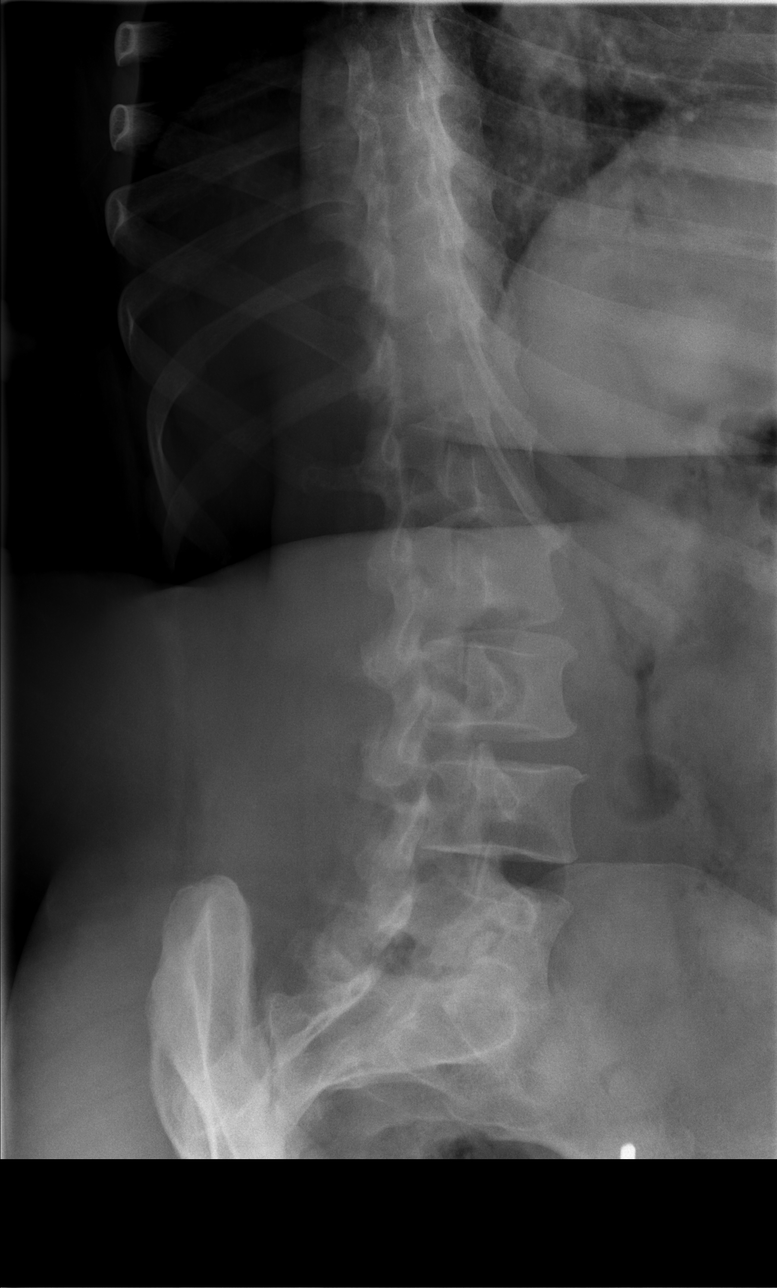

[t l-spine lat]
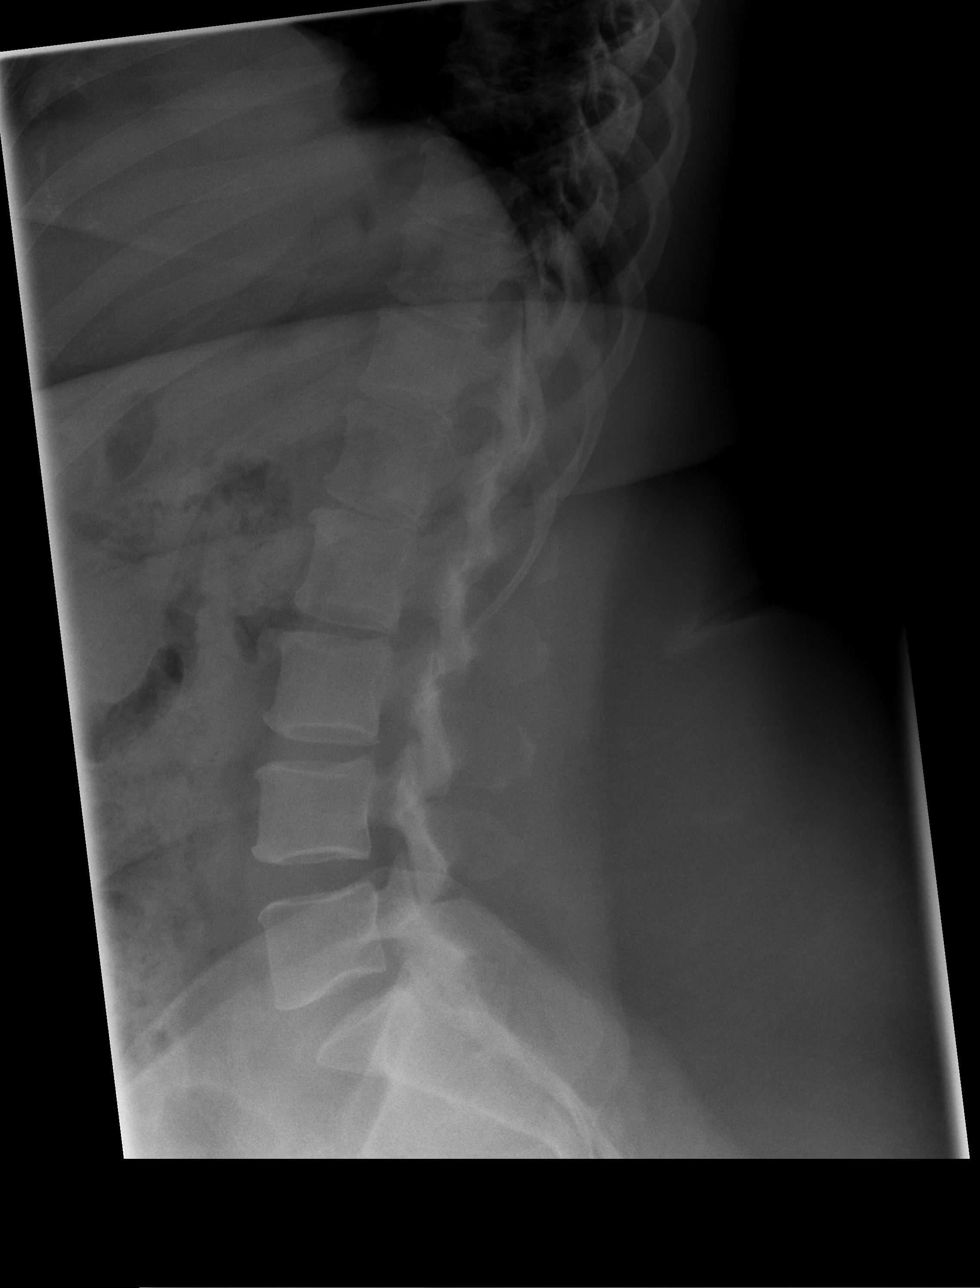

[t l-spine l5-s1 spot]
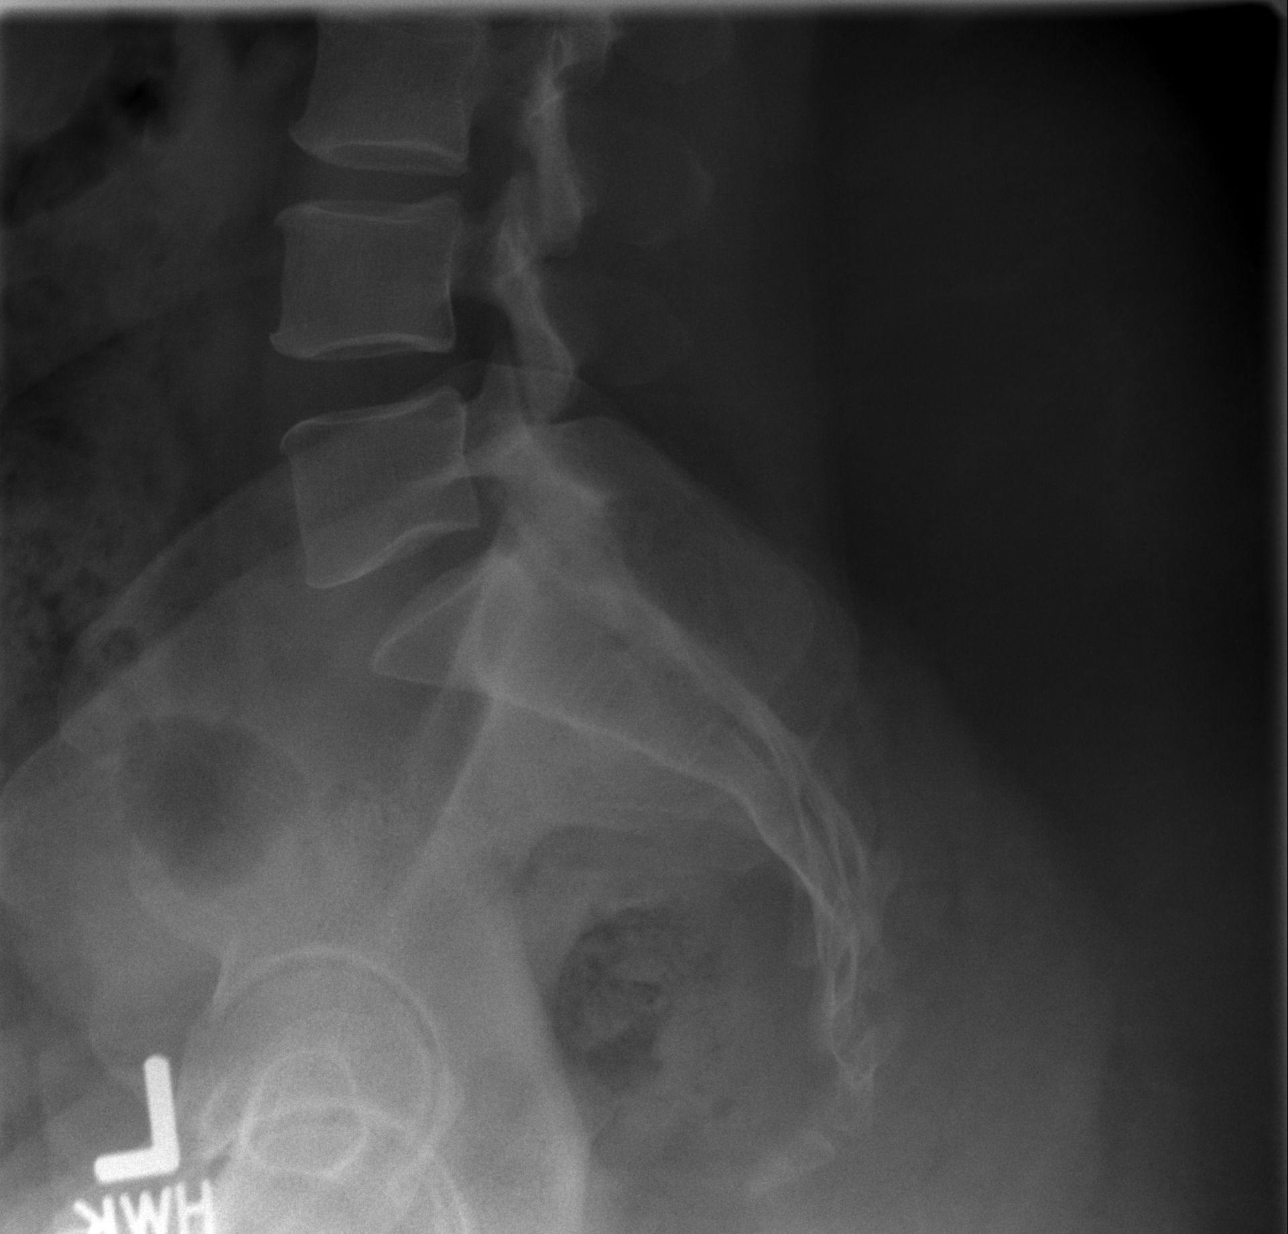

[5 of 5 positions shown; findings below may reference images not displayed]

FINDINGS: Bilateral facet DJD L5-S1, left worse than right. Normal alignment.
Vertebral body and intervertebral disc height maintained throughout.
Negative for fracture. Tiny anterior endplate spurs at all lumbar
levels L1-L5.
IMPRESSION: 1. Negative for fracture or other acute bone abnormality.
2. Facet DJD L5-S1.

## 2015-10-26 IMAGING — CR DG KNEE 1-2V*R*
2 series · 2 of 2 positions shown · non-contrast
Comparison: None.

CLINICAL DATA: Right knee pain.

EXAM:
RIGHT KNEE - 1-2 VIEW

[w knee ap right *]
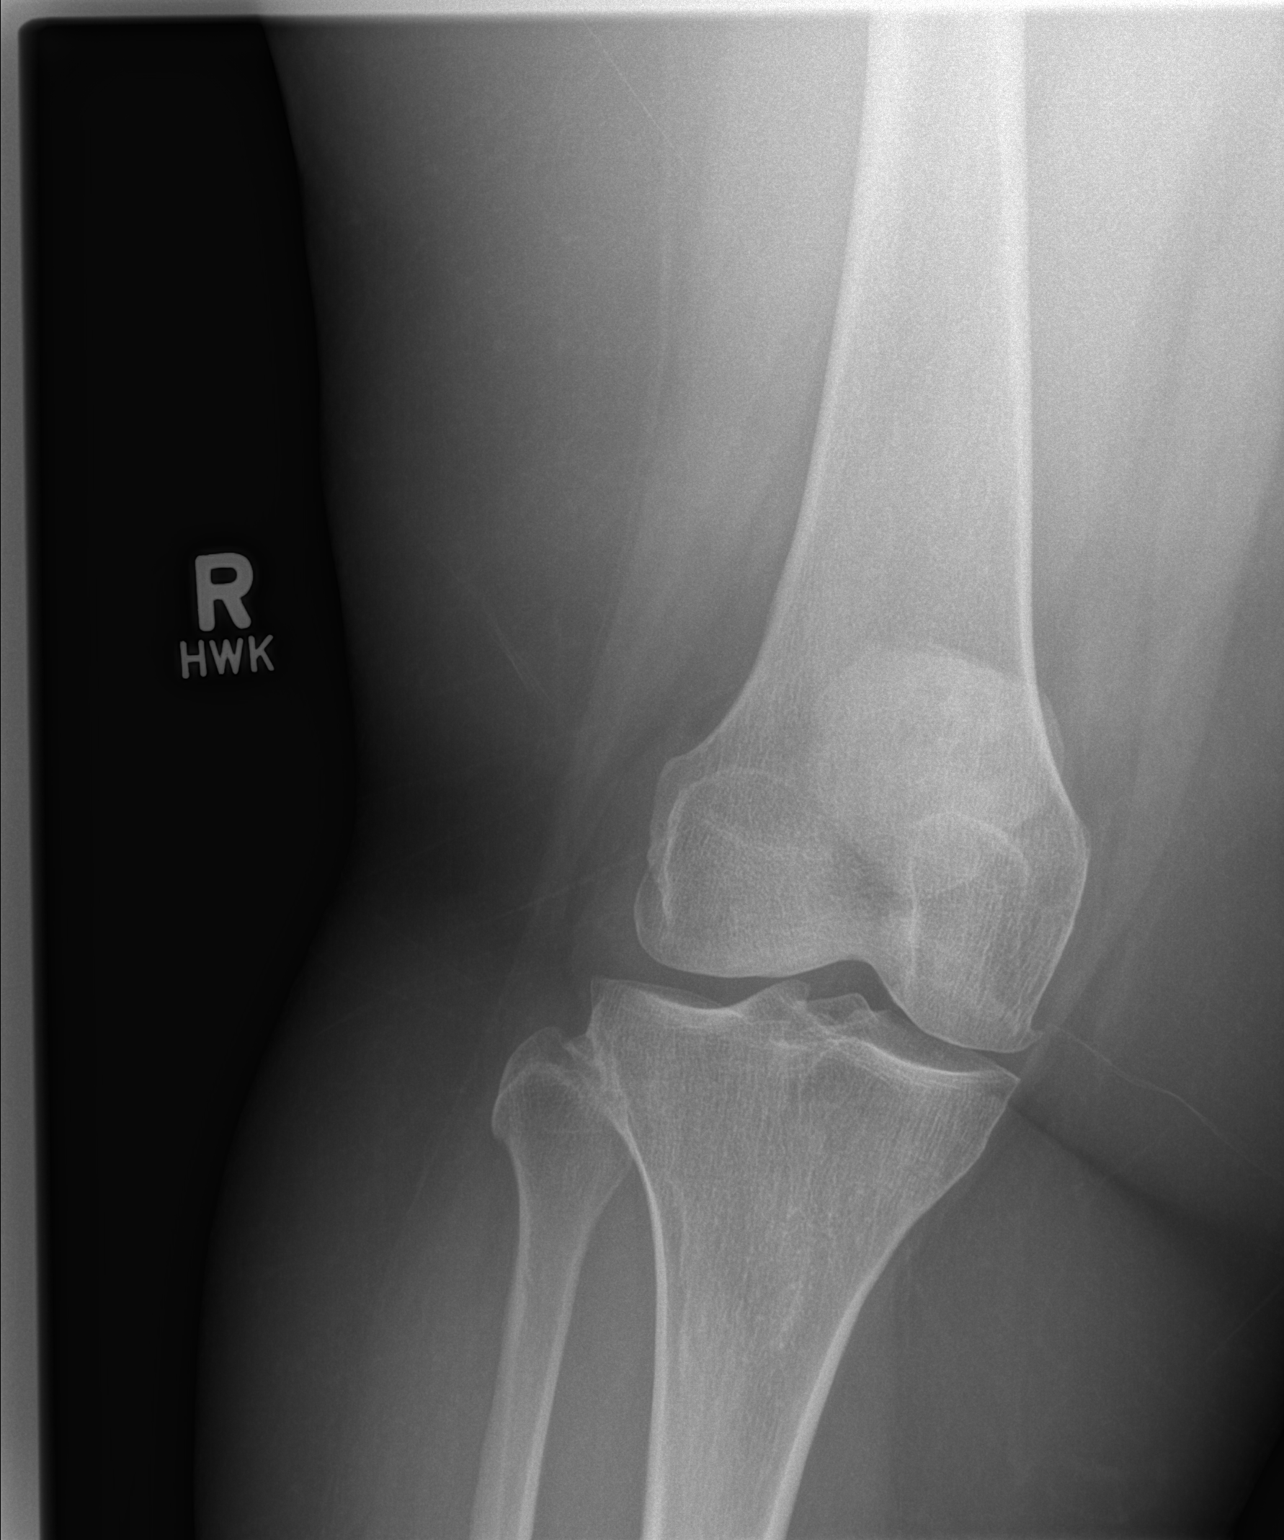

[w knee lat. right *]
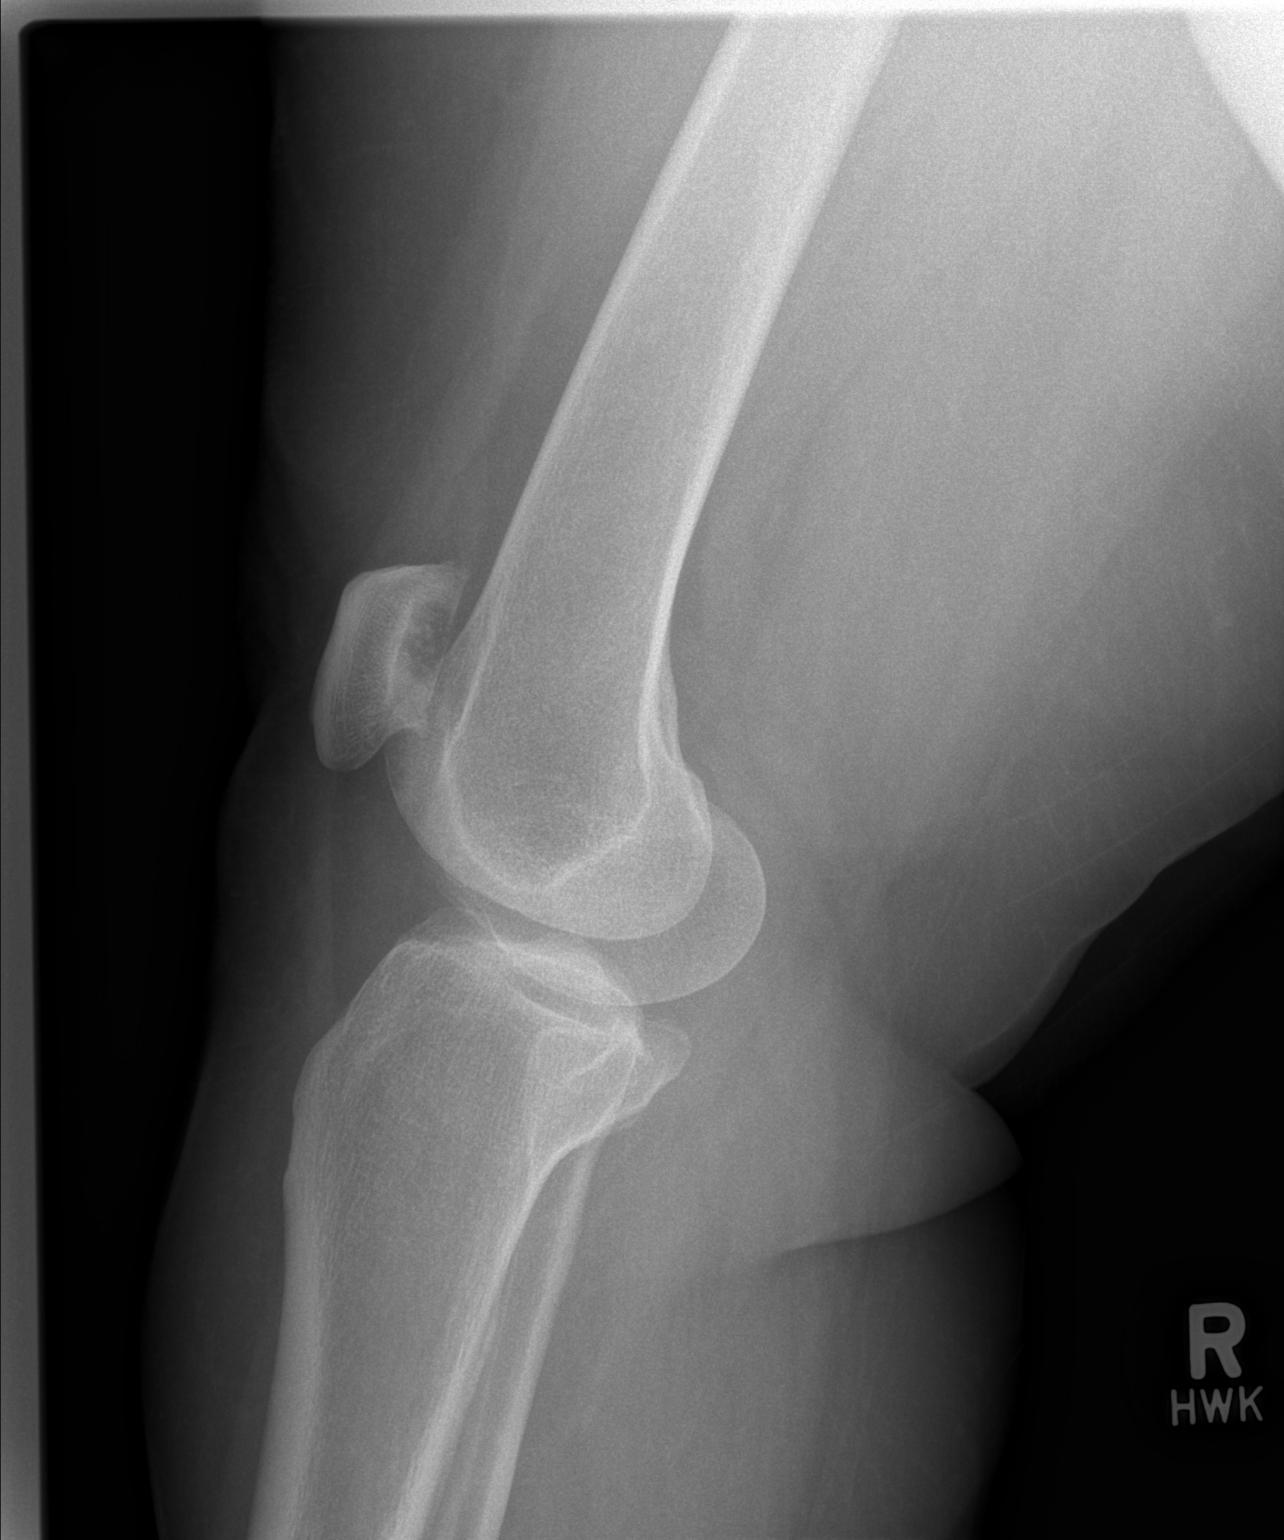

[2 of 2 positions shown; findings below may reference images not displayed]

FINDINGS: There is no acute bony or joint abnormality. There is patellofemoral
degenerative change with a large subchondral cyst in the mid pole of
the patella. Small osteophytes are present about the patellofemoral
and medial compartments. There is no joint effusion.
IMPRESSION: No acute finding.

Patellofemoral worse than medial compartment degenerative disease.

## 2019-04-10 ENCOUNTER — Emergency Department (HOSPITAL_COMMUNITY)
Admission: EM | Admit: 2019-04-10 | Discharge: 2019-04-11 | Disposition: A | Discharge status: Home / Self Care | Payer: Self-pay | Attending: Emergency Medicine | Admitting: Emergency Medicine

## 2019-04-10 ENCOUNTER — Other Ambulatory Visit: Payer: Self-pay

## 2019-04-10 ENCOUNTER — Encounter (HOSPITAL_COMMUNITY): Payer: Self-pay

## 2019-04-10 DIAGNOSIS — R51 Headache: Secondary | ICD-10-CM | POA: Insufficient documentation

## 2019-04-10 DIAGNOSIS — I10 Essential (primary) hypertension: Secondary | ICD-10-CM | POA: Insufficient documentation

## 2019-04-10 DIAGNOSIS — R5383 Other fatigue: Secondary | ICD-10-CM | POA: Insufficient documentation

## 2019-04-10 DIAGNOSIS — N3000 Acute cystitis without hematuria: Secondary | ICD-10-CM | POA: Insufficient documentation

## 2019-04-10 DIAGNOSIS — R197 Diarrhea, unspecified: Secondary | ICD-10-CM | POA: Insufficient documentation

## 2019-04-10 DIAGNOSIS — Z79899 Other long term (current) drug therapy: Secondary | ICD-10-CM | POA: Insufficient documentation

## 2019-04-10 DIAGNOSIS — E119 Type 2 diabetes mellitus without complications: Secondary | ICD-10-CM | POA: Insufficient documentation

## 2019-04-10 HISTORY — DX: Type 2 diabetes mellitus without complications: E11.9

## 2019-04-10 LAB — COMPREHENSIVE METABOLIC PANEL
ALT: 20 U/L (ref 0–44)
AST: 31 U/L (ref 15–41)
Albumin: 3.7 g/dL (ref 3.5–5.0)
Alkaline Phosphatase: 69 U/L (ref 38–126)
Anion gap: 12 (ref 5–15)
BUN: 8 mg/dL (ref 6–20)
CO2: 24 mmol/L (ref 22–32)
Calcium: 8.8 mg/dL — ABNORMAL LOW (ref 8.9–10.3)
Chloride: 99 mmol/L (ref 98–111)
Creatinine, Ser: 0.78 mg/dL (ref 0.44–1.00)
GFR calc Af Amer: >60 mL/min (ref >60)
GFR calc non Af Amer: >60 mL/min (ref >60)
Glucose, Bld: 184 mg/dL — ABNORMAL HIGH (ref 70–99)
Potassium: 3.6 mmol/L (ref 3.5–5.1)
Sodium: 135 mmol/L (ref 135–145)
Total Bilirubin: 0.7 mg/dL (ref 0.3–1.2)
Total Protein: 7.9 g/dL (ref 6.5–8.1)

## 2019-04-10 LAB — URINALYSIS, ROUTINE W REFLEX MICROSCOPIC
Bilirubin Urine: NEGATIVE
Glucose, UA: NEGATIVE mg/dL
Ketones, ur: NEGATIVE mg/dL
Nitrite: NEGATIVE
Protein, ur: NEGATIVE mg/dL
Specific Gravity, Urine: 1.003 — ABNORMAL LOW (ref 1.005–1.030)
pH: 6 (ref 5.0–8.0)

## 2019-04-10 LAB — CBC
HCT: 41.2 % (ref 36.0–46.0)
Hemoglobin: 13 g/dL (ref 12.0–15.0)
MCH: 27.4 pg (ref 26.0–34.0)
MCHC: 31.6 g/dL (ref 30.0–36.0)
MCV: 86.9 fL (ref 80.0–100.0)
Platelets: 221 10*3/uL (ref 150–400)
RBC: 4.74 MIL/uL (ref 3.87–5.11)
RDW: 13.2 % (ref 11.5–15.5)
WBC: 4.8 10*3/uL (ref 4.0–10.5)
nRBC: 0 % (ref 0.0–0.2)

## 2019-04-10 LAB — LIPASE, BLOOD: Lipase: 37 U/L (ref 11–51)

## 2019-04-10 LAB — I-STAT BETA HCG BLOOD, ED (MC, WL, AP ONLY): I-stat hCG, quantitative: <5 m[IU]/mL (ref <5)

## 2019-04-10 MED ORDER — SODIUM CHLORIDE 0.9% FLUSH: 3.0000 mL | Freq: Once | INTRAVENOUS | Status: DC

## 2019-04-10 NOTE — ED Triage Notes (Addendum)
Pt arrives with family for fever and headache x8 days. Pt also reports diarrhea x5 days. Family at bedside translating.

## 2019-04-10 NOTE — ED Notes (Signed)
Pt does not speak English, exception made for family member to stay with patient.

## 2019-04-11 ENCOUNTER — Other Ambulatory Visit: Payer: Self-pay

## 2019-04-11 MED ORDER — CEPHALEXIN 500 MG PO CAPS: 500.0000 mg | ORAL_CAPSULE | Freq: Four times a day (QID) | ORAL | 0 refills | Status: AC

## 2019-04-11 MED ORDER — CEPHALEXIN 250 MG PO CAPS
500.0000 mg | ORAL_CAPSULE | Freq: Once | ORAL | Status: AC
  Administered 2019-04-11: 500 mg via ORAL
  Filled 2019-04-11: qty 2

## 2019-04-11 MED ORDER — CEPHALEXIN 500 MG PO CAPS: 500.0000 mg | ORAL_CAPSULE | Freq: Four times a day (QID) | ORAL | 0 refills | Status: DC

## 2019-04-11 NOTE — Discharge Instructions (Addendum)
Take the antibiotic as directed for the urinary tract infection. Take Tylenol for any aches and fever.  Push fluids to avoid dehydration. Follow up with your doctor for recheck later this week if symptoms persist.

## 2019-04-11 NOTE — ED Provider Notes (Signed)
[facility]   CSN: [id number] Arrival date & time: 04/10/19  2209    History   Chief Complaint Chief Complaint  Patient presents with  . Fever  . Diarrhea    HPI Taylor Aguirre is a 53 y.o. female.     Patient with history of DM presents with fatigue and subjective fever x 8 days, with progressive non-bloody diarrhea, now having 6-7 BM's daily. No cough, congestion, shortness of breath, chest or abdominal pain. She denies urinary symptoms or flank pain. She has been eating and drinking per her normal. She also expresses concern for her blood sugar as she has not been checking it at home. No nausea or vomiting.  The history is provided by the patient and a relative. A language interpreter was used (Son at bedside interpreting. ).  Fever  Associated symptoms: diarrhea and headaches   Associated symptoms: no chest pain, no congestion, no cough, no dysuria, no myalgias, no nausea, no sore throat and no vomiting   Diarrhea  Associated symptoms: fever and headaches   Associated symptoms: no abdominal pain, no diaphoresis, no myalgias and no vomiting     Past Medical History:  Diagnosis Date  . Diabetes mellitus without complication (HCC)   . Hypertension     There are no active problems to display for this patient.   History reviewed. No pertinent surgical history.   OB History   No obstetric history on file.      Home Medications    Prior to Admission medications   Medication Sig Start Date End Date Taking? Authorizing Provider  amoxicillin (AMOXIL) 500 MG capsule Take 1 capsule (500 mg total) by mouth 3 (three) times daily. 09/05/14   Reuben LikesKeller, David C, MD  ipratropium (ATROVENT) 0.06 % nasal spray Place 2 sprays into both nostrils 4 (four) times daily. 08/29/14   Reuben LikesKeller, David C, MD  meloxicam (MOBIC) 7.5 MG tablet Take 1 tablet (7.5 mg total) by mouth 2 (two) times daily. 02/15/14   Quentin AngstJegede, Olugbemiga E, MD   metoprolol tartrate (LOPRESSOR) 25 MG tablet Take 1 tablet (25 mg total) by mouth 2 (two) times daily. 02/15/14   Quentin AngstJegede, Olugbemiga E, MD  metoprolol tartrate (LOPRESSOR) 25 MG tablet Take 1 tablet (25 mg total) by mouth 2 (two) times daily. 08/29/14   Reuben LikesKeller, David C, MD  naproxen (NAPROSYN) 500 MG tablet Take 1 tablet (500 mg total) by mouth 2 (two) times daily. 08/29/14   Reuben LikesKeller, David C, MD    Family History No family history on file.  Social History Social History   Tobacco Use  . Smoking status: Never Smoker  . Smokeless tobacco: Never Used  Substance Use Topics  . Alcohol use: No  . Drug use: Not on file     Allergies   Patient has no known allergies.   Review of Systems Review of Systems  Constitutional: Positive for fatigue and fever. Negative for diaphoresis.  HENT: Negative.  Negative for congestion, sinus pressure, sinus pain and sore throat.   Respiratory: Negative for cough and shortness of breath.   Cardiovascular: Negative for chest pain.  Gastrointestinal: Positive for diarrhea. Negative for abdominal pain, blood in stool, nausea and vomiting.  Genitourinary: Negative for dysuria.  Musculoskeletal: Negative for myalgias.  Neurological: Positive for headaches. Negative for weakness.     Physical Exam Updated Vital Signs BP (!) 151/74 (BP Location: Right Arm)   Pulse 89   Temp 99.4 F (37.4 C) (Oral)  Resp 20   Ht 5\' 5"  (1.651 m)   SpO2 96%   BMI 42.33 kg/m   Physical Exam Vitals signs and nursing note reviewed.  Constitutional:      Appearance: She is well-developed.  HENT:     Head: Normocephalic.  Neck:     Musculoskeletal: Normal range of motion and neck supple.  Cardiovascular:     Rate and Rhythm: Normal rate and regular rhythm.     Heart sounds: No murmur.  Pulmonary:     Effort: Pulmonary effort is normal.     Breath sounds: Normal breath sounds.  Abdominal:     General: Bowel sounds are normal.     Palpations: Abdomen is soft.      Tenderness: There is no abdominal tenderness. There is no guarding or rebound.  Musculoskeletal: Normal range of motion.  Skin:    General: Skin is warm and dry.     Findings: No rash.  Neurological:     Mental Status: She is alert and oriented to person, place, and time.      ED Treatments / Results  Labs (all labs ordered are listed, but only abnormal results are displayed) Labs Reviewed  COMPREHENSIVE METABOLIC PANEL - Abnormal; Notable for the following components:      Result Value   Glucose, Bld 184 (*)    Calcium 8.8 (*)    All other components within normal limits  URINALYSIS, ROUTINE W REFLEX MICROSCOPIC - Abnormal; Notable for the following components:   APPearance HAZY (*)    Specific Gravity, Urine 1.003 (*)    Hgb urine dipstick SMALL (*)    Leukocytes,Ua LARGE (*)    Bacteria, UA MANY (*)    All other components within normal limits  LIPASE, BLOOD  CBC  I-STAT BETA HCG BLOOD, ED ([facility], [facility], [facility] ONLY)    EKG None  Radiology No results found.  Procedures Procedures (including critical care time)  Medications Ordered in ED Medications  sodium chloride flush (NS) 0.9 % injection 3 mL (has no administration in time range)  cephALEXin (KEFLEX) capsule 500 mg (has no administration in time range)     Initial Impression / Assessment and Plan / ED Course  I have reviewed the triage vital signs and the nursing notes.  Pertinent labs & imaging results that were available during my care of the patient were reviewed by me and considered in my medical decision making (see chart for details).        Patient to ED with diarrhea and fatigue. No fever, vomiting, URI symptoms, urinary symptoms.   She is well appearing. No abdominal tenderness, no fever. UA is positive for infection. Will start on Keflex and encourage recheck with PCP. Return precautions discussed.   Final Clinical Impressions(s) / ED Diagnoses   Final diagnoses:  None   1. UTI  ED  Discharge Orders    None       Elpidio Anis, PA-C 04/11/19 [id number]    Palumbo, April, MD 04/11/19 [phone]

## 2019-04-18 ENCOUNTER — Emergency Department (HOSPITAL_COMMUNITY)
Admission: EM | Admit: 2019-04-18 | Discharge: 2019-04-19 | Disposition: A | Discharge status: Home / Self Care | Payer: Self-pay | Attending: Emergency Medicine | Admitting: Emergency Medicine

## 2019-04-18 ENCOUNTER — Other Ambulatory Visit: Payer: Self-pay

## 2019-04-18 ENCOUNTER — Encounter (HOSPITAL_COMMUNITY): Payer: Self-pay | Admitting: Emergency Medicine

## 2019-04-18 DIAGNOSIS — R748 Abnormal levels of other serum enzymes: Secondary | ICD-10-CM

## 2019-04-18 DIAGNOSIS — E119 Type 2 diabetes mellitus without complications: Secondary | ICD-10-CM | POA: Insufficient documentation

## 2019-04-18 DIAGNOSIS — Z79899 Other long term (current) drug therapy: Secondary | ICD-10-CM | POA: Insufficient documentation

## 2019-04-18 DIAGNOSIS — R197 Diarrhea, unspecified: Secondary | ICD-10-CM

## 2019-04-18 DIAGNOSIS — R109 Unspecified abdominal pain: Secondary | ICD-10-CM | POA: Insufficient documentation

## 2019-04-18 DIAGNOSIS — I10 Essential (primary) hypertension: Secondary | ICD-10-CM | POA: Insufficient documentation

## 2019-04-18 LAB — URINALYSIS, ROUTINE W REFLEX MICROSCOPIC
Bilirubin Urine: NEGATIVE
Glucose, UA: NEGATIVE mg/dL
Ketones, ur: NEGATIVE mg/dL
Nitrite: NEGATIVE
Protein, ur: NEGATIVE mg/dL
Specific Gravity, Urine: 1.015 (ref 1.005–1.030)
pH: 5 (ref 5.0–8.0)

## 2019-04-18 LAB — CBC
HCT: 45.7 % (ref 36.0–46.0)
Hemoglobin: 14.2 g/dL (ref 12.0–15.0)
MCH: 27.4 pg (ref 26.0–34.0)
MCHC: 31.1 g/dL (ref 30.0–36.0)
MCV: 88.2 fL (ref 80.0–100.0)
Platelets: 473 10*3/uL — ABNORMAL HIGH (ref 150–400)
RBC: 5.18 MIL/uL — ABNORMAL HIGH (ref 3.87–5.11)
RDW: 12.9 % (ref 11.5–15.5)
WBC: 7.7 10*3/uL (ref 4.0–10.5)
nRBC: 0 % (ref 0.0–0.2)

## 2019-04-18 MED ORDER — SODIUM CHLORIDE 0.9 % IV BOLUS: 1000.0000 mL | Freq: Once | INTRAVENOUS | Status: DC

## 2019-04-18 MED ORDER — SODIUM CHLORIDE 0.9% FLUSH: 3.0000 mL | Freq: Once | INTRAVENOUS | Status: DC

## 2019-04-18 NOTE — ED Provider Notes (Signed)
[facility] EMERGENCY DEPARTMENT Provider Note   CSN: [id number] Arrival date & time: 04/18/19  2236    History   Chief Complaint No chief complaint on file.   HPI Taylor Aguirre is a 53 y.o. female.     Patient with history of diabetes presents the emergency department tonight with ongoing diarrhea and intermittent abdominal pains.  Patient was initially seen in the emergency department on 4/27.  She had lab work-up was which was reassuring.  UA with questionable UTI and patient was sent home on Keflex.  Her symptoms have persisted.  She has had watery stool up until 2 days ago and now it has been more formed.  She denies seeing any blood in the diarrhea.  She will have bowel movements every several hours.  No fevers, chest pain, or shortness of breath.  No urinary symptoms.  Level V caveat due to language barrier.  Patient's family member is at bedside and interprets due to her particular [place] dialect.     Past Medical History:  Diagnosis Date  . Diabetes mellitus without complication (HCC)   . Hypertension     There are no active problems to display for this patient.   History reviewed. No pertinent surgical history.   OB History   No obstetric history on file.      Home Medications    Prior to Admission medications   Medication Sig Start Date End Date Taking? Authorizing Provider  amoxicillin (AMOXIL) 500 MG capsule Take 1 capsule (500 mg total) by mouth 3 (three) times daily. 09/05/14   Reuben Likes, MD  cephALEXin (KEFLEX) 500 MG capsule Take 1 capsule (500 mg total) by mouth 4 (four) times daily. 04/11/19   Elpidio Anis, PA-C  ipratropium (ATROVENT) 0.06 % nasal spray Place 2 sprays into both nostrils 4 (four) times daily. 08/29/14   Reuben Likes, MD  meloxicam (MOBIC) 7.5 MG tablet Take 1 tablet (7.5 mg total) by mouth 2 (two) times daily. 02/15/14   Quentin Angst, MD  metoprolol tartrate (LOPRESSOR) 25 MG tablet Take 1 tablet (25  mg total) by mouth 2 (two) times daily. 02/15/14   Quentin Angst, MD  metoprolol tartrate (LOPRESSOR) 25 MG tablet Take 1 tablet (25 mg total) by mouth 2 (two) times daily. 08/29/14   Reuben Likes, MD  naproxen (NAPROSYN) 500 MG tablet Take 1 tablet (500 mg total) by mouth 2 (two) times daily. 08/29/14   Reuben Likes, MD    Family History History reviewed. No pertinent family history.  Social History Social History   Tobacco Use  . Smoking status: Never Smoker  . Smokeless tobacco: Never Used  Substance Use Topics  . Alcohol use: No  . Drug use: Never     Allergies   Patient has no known allergies.   Review of Systems Review of Systems  Constitutional: Negative for fever.  HENT: Negative for rhinorrhea and sore throat.   Eyes: Negative for redness.  Respiratory: Negative for cough.   Cardiovascular: Negative for chest pain.  Gastrointestinal: Positive for abdominal pain and diarrhea. Negative for blood in stool, nausea and vomiting.  Genitourinary: Negative for dysuria.  Musculoskeletal: Negative for myalgias.  Skin: Negative for rash.  Neurological: Negative for headaches.     Physical Exam Updated Vital Signs BP 123/78 (BP Location: Right Arm)   Pulse (!) 102   Temp 98.8 F (37.1 C) (Oral)   SpO2 99%   Physical Exam Vitals signs and nursing  note reviewed.  Constitutional:      Appearance: She is well-developed.  HENT:     Head: Normocephalic and atraumatic.     Mouth/Throat:     Mouth: Mucous membranes are dry.  Eyes:     General:        Right eye: No discharge.        Left eye: No discharge.     Conjunctiva/sclera: Conjunctivae normal.  Neck:     Musculoskeletal: Normal range of motion and neck supple.  Cardiovascular:     Rate and Rhythm: Regular rhythm. Tachycardia present.     Heart sounds: Normal heart sounds.  Pulmonary:     Effort: Pulmonary effort is normal.     Breath sounds: Normal breath sounds.  Abdominal:     Palpations:  Abdomen is soft.     Tenderness: There is no abdominal tenderness. There is no guarding or rebound.  Skin:    General: Skin is warm and dry.  Neurological:     Mental Status: She is alert.      ED Treatments / Results  Labs (all labs ordered are listed, but only abnormal results are displayed) Labs Reviewed  LIPASE, BLOOD - Abnormal; Notable for the following components:      Result Value   Lipase 57 (*)    All other components within normal limits  COMPREHENSIVE METABOLIC PANEL - Abnormal; Notable for the following components:   Potassium 3.4 (*)    Glucose, Bld 199 (*)    All other components within normal limits  CBC - Abnormal; Notable for the following components:   RBC 5.18 (*)    Platelets 473 (*)    All other components within normal limits  URINALYSIS, ROUTINE W REFLEX MICROSCOPIC - Abnormal; Notable for the following components:   APPearance HAZY (*)    Hgb urine dipstick SMALL (*)    Leukocytes,Ua SMALL (*)    Bacteria, UA RARE (*)    All other components within normal limits    EKG None  Radiology No results found.  Procedures Procedures (including critical care time)  Medications Ordered in ED Medications  sodium chloride flush (NS) 0.9 % injection 3 mL (has no administration in time range)  sodium chloride 0.9 % bolus 1,000 mL (has no administration in time range)     Initial Impression / Assessment and Plan / ED Course  I have reviewed the triage vital signs and the nursing notes.  Pertinent labs & imaging results that were available during my care of the patient were reviewed by me and considered in my medical decision making (see chart for details).        Patient seen and examined.  Labs ordered.  Will hydrate with IV fluids.  Given ongoing symptoms and intermittent pain, CT of the abdomen and pelvis has been ordered.  Will obtain GI pathogen studies if patient is able to give a specimen.  Vital signs reviewed and are as follows: BP  123/78 (BP Location: Right Arm)   Pulse (!) 102   Temp 98.8 F (37.1 C) (Oral)   SpO2 99%   2:00 AM called the patient's room.  Patient no longer wants evaluation.  She does not want to be stuck and does not want any imaging.  We discussed results this point including slightly elevated lipase.  Discussed that she will need to follow-up closely with her primary care doctor.  On reexam, her abdomen remained soft and nontender.  She has not had any episodes  of diarrhea while in emergency department.  She is requesting medication for diarrhea and work note for 2 weeks.  Discussed that I am comfortable with her exam with her going home, however discussed signs and symptoms which should cause her to return to the emergency department.  The patient was urged to return to the Emergency Department immediately with worsening of current symptoms, worsening abdominal pain, persistent vomiting, blood noted in stools, fever, or any other concerns. The patient verbalized understanding.  Strongly encouraged recheck with her doctor in 2 days to ensure that symptoms are improving.   Final Clinical Impressions(s) / ED Diagnoses   Final diagnoses:  Diarrhea, unspecified type  Elevated lipase   Diarrhea: Ongoing over the past 1 to 2 weeks.  Electrolytes look okay, potassium slightly low at 3.4.  Patient looks slightly dry on exam but is tolerating oral fluids without vomiting.  She has had intermittent abdominal pain.  No abdominal pain at time of exam.  Lab work-up is reassuring.  Initially CT was planned however patient changed her mind about this and no longer wants it.  She does seem to understand signs and symptoms which should cause her to return and seems willing to follow-up with her primary care doctor.  Normal kidney function.  Elevated lipase: No active abdominal pain.  Doubt pancreatitis.  No vomiting.  No back pain.   ED Discharge Orders         Ordered    loperamide (IMODIUM) 2 MG capsule  4 times  daily PRN     04/19/19 0148           Renne CriglerGeiple, Tyke Outman, PA-C 04/19/19 [id number]    Shon BatonHorton, Courtney F, MD 04/19/19 [id number]

## 2019-04-18 NOTE — ED Triage Notes (Signed)
Pt here with family, was seen on the 27th for same, is still having diarrhea. Has been taking her cephalexin.

## 2019-04-19 LAB — COMPREHENSIVE METABOLIC PANEL
ALT: 34 U/L (ref 0–44)
AST: 28 U/L (ref 15–41)
Albumin: 3.8 g/dL (ref 3.5–5.0)
Alkaline Phosphatase: 100 U/L (ref 38–126)
Anion gap: 13 (ref 5–15)
BUN: 17 mg/dL (ref 6–20)
CO2: 22 mmol/L (ref 22–32)
Calcium: 9.5 mg/dL (ref 8.9–10.3)
Chloride: 103 mmol/L (ref 98–111)
Creatinine, Ser: 0.77 mg/dL (ref 0.44–1.00)
GFR calc Af Amer: >60 mL/min (ref >60)
GFR calc non Af Amer: >60 mL/min (ref >60)
Glucose, Bld: 199 mg/dL — ABNORMAL HIGH (ref 70–99)
Potassium: 3.4 mmol/L — ABNORMAL LOW (ref 3.5–5.1)
Sodium: 138 mmol/L (ref 135–145)
Total Bilirubin: 0.4 mg/dL (ref 0.3–1.2)
Total Protein: 8 g/dL (ref 6.5–8.1)

## 2019-04-19 LAB — LIPASE, BLOOD: Lipase: 57 U/L — ABNORMAL HIGH (ref 11–51)

## 2019-04-19 MED ORDER — LOPERAMIDE HCL 2 MG PO CAPS: 2.0000 mg | ORAL_CAPSULE | Freq: Four times a day (QID) | ORAL | 0 refills | Status: AC | PRN

## 2019-04-19 NOTE — ED Notes (Signed)
This NT attempted to get blood work, however patient refused and stated that she is feeling better and all she needs is a medication refill and a doctors note for work. Gerri Spore, RN has been notified.

## 2019-04-19 NOTE — Discharge Instructions (Signed)
Please read and follow all provided instructions.  Your diagnoses today include:  1. Diarrhea, unspecified type   2. Elevated lipase     Tests performed today include:  Blood counts and electrolytes  Blood tests to check liver and kidney function  Blood tests to check pancreas function - was slightly high  Urine test to look for infection   Vital signs. See below for your results today.   Medications prescribed:   Imodium - medication for diarrhea  Take any prescribed medications only as directed.  Home care instructions:   Follow any educational materials contained in this packet.   Follow-up instructions: Please follow-up with your primary care provider in the next 2 days for further evaluation of your symptoms. If you are not feeling better in 48 hours you may have a condition that is more serious and you need re-evaluation.   Return instructions:  SEEK IMMEDIATE MEDICAL ATTENTION IF:  If you have pain that does not go away or becomes severe   A temperature above 101F develops   Repeated vomiting occurs (multiple episodes)   If you have pain that becomes localized to portions of the abdomen. The right side could possibly be appendicitis. In an adult, the left lower portion of the abdomen could be colitis or diverticulitis.   Blood is being passed in stools or vomit (bright red or black tarry stools)   You develop chest pain, difficulty breathing, dizziness or fainting, or become confused, poorly responsive, or inconsolable (young children)  If you have any other emergent concerns regarding your health  Additional Information: Abdominal (belly) pain can be caused by many things. Your caregiver performed an examination and possibly ordered blood/urine tests and imaging (CT scan, x-rays, ultrasound). Many cases can be observed and treated at home after initial evaluation in the emergency department. Even though you are being discharged home, abdominal pain can be  unpredictable. Therefore, you need a repeated exam if your pain does not resolve, returns, or worsens. Most patients with abdominal pain don't have to be admitted to the hospital or have surgery, but serious problems like appendicitis and gallbladder attacks can start out as nonspecific pain. Many abdominal conditions cannot be diagnosed in one visit, so follow-up evaluations are very important.  Your vital signs today were: BP 123/78 (BP Location: Right Arm)    Pulse (!) 102    Temp 98.8 F (37.1 C) (Oral)    SpO2 99%  If your blood pressure (bp) was elevated above 135/85 this visit, please have this repeated by your doctor within one month. --------------

## 2021-11-20 ENCOUNTER — Ambulatory Visit: Payer: Self-pay | Admitting: Internal Medicine

## 2022-01-30 ENCOUNTER — Ambulatory Visit: Payer: Self-pay | Attending: Internal Medicine | Admitting: Internal Medicine
